# Patient Record
Sex: Female | Born: 1995 | Race: Black or African American | Hispanic: No | Marital: Single | Smoking: Current every day smoker
Health system: Southern US, Community
[De-identification: ages and names within clinical notes are randomized; demographics above are authoritative.]

---

## 2016-05-26 ENCOUNTER — Emergency Department (HOSPITAL_COMMUNITY): Payer: BLUE CROSS/BLUE SHIELD

## 2016-05-26 ENCOUNTER — Emergency Department (HOSPITAL_COMMUNITY)
Admission: EM | Admit: 2016-05-26 | Discharge: 2016-05-26 | Disposition: A | Discharge status: Home / Self Care | Payer: BLUE CROSS/BLUE SHIELD | Attending: Emergency Medicine | Admitting: Emergency Medicine

## 2016-05-26 ENCOUNTER — Encounter (HOSPITAL_COMMUNITY): Payer: Self-pay | Admitting: Emergency Medicine

## 2016-05-26 DIAGNOSIS — R0781 Pleurodynia: Secondary | ICD-10-CM | POA: Diagnosis present

## 2016-05-26 DIAGNOSIS — R0789 Other chest pain: Secondary | ICD-10-CM | POA: Diagnosis not present

## 2016-05-26 DIAGNOSIS — F17200 Nicotine dependence, unspecified, uncomplicated: Secondary | ICD-10-CM | POA: Diagnosis not present

## 2016-05-26 LAB — CBC WITH DIFFERENTIAL/PLATELET
Basophils Absolute: 0 10*3/uL (ref 0.0–0.1)
Basophils Relative: 0 %
EOS ABS: 0.2 10*3/uL (ref 0.0–0.7)
EOS PCT: 2 %
HCT: 41.2 % (ref 36.0–46.0)
Hemoglobin: 13.8 g/dL (ref 12.0–15.0)
LYMPHS ABS: 2.2 10*3/uL (ref 0.7–4.0)
Lymphocytes Relative: 25 %
MCH: 27.3 pg (ref 26.0–34.0)
MCHC: 33.5 g/dL (ref 30.0–36.0)
MCV: 81.4 fL (ref 78.0–100.0)
MONO ABS: 0.7 10*3/uL (ref 0.1–1.0)
MONOS PCT: 8 %
Neutro Abs: 6 10*3/uL (ref 1.7–7.7)
Neutrophils Relative %: 65 %
PLATELETS: 225 10*3/uL (ref 150–400)
RBC: 5.06 MIL/uL (ref 3.87–5.11)
RDW: 13.2 % (ref 11.5–15.5)
WBC: 9.1 10*3/uL (ref 4.0–10.5)

## 2016-05-26 LAB — COMPREHENSIVE METABOLIC PANEL
ALT: 13 U/L — ABNORMAL LOW (ref 14–54)
ANION GAP: 6 (ref 5–15)
AST: 19 U/L (ref 15–41)
Albumin: 4.4 g/dL (ref 3.5–5.0)
Alkaline Phosphatase: 53 U/L (ref 38–126)
BUN: 7 mg/dL (ref 6–20)
CHLORIDE: 107 mmol/L (ref 101–111)
CO2: 24 mmol/L (ref 22–32)
Calcium: 9.4 mg/dL (ref 8.9–10.3)
Creatinine, Ser: 0.77 mg/dL (ref 0.44–1.00)
Glucose, Bld: 106 mg/dL — ABNORMAL HIGH (ref 65–99)
Potassium: 3.6 mmol/L (ref 3.5–5.1)
Sodium: 137 mmol/L (ref 135–145)
Total Bilirubin: 1 mg/dL (ref 0.3–1.2)
Total Protein: 7.2 g/dL (ref 6.5–8.1)

## 2016-05-26 LAB — I-STAT TROPONIN, ED
Troponin i, poc: 0 ng/mL (ref 0.00–0.08)
Troponin i, poc: 0 ng/mL (ref 0.00–0.08)

## 2016-05-26 LAB — D-DIMER, QUANTITATIVE: D-Dimer, Quant: 0.36 ug/mL-FEU (ref 0.00–0.50)

## 2016-05-26 LAB — POC URINE PREG, ED: PREG TEST UR: NEGATIVE

## 2016-05-26 MED ORDER — NAPROXEN 500 MG PO TABS: 500.0000 mg | ORAL_TABLET | Freq: Two times a day (BID) | ORAL | 0 refills | Status: AC | PRN

## 2016-05-26 NOTE — ED Provider Notes (Signed)
[facility]-EMERGENCY DEPT Provider Note   CSN: [id number] Arrival date & time: 05/26/16  0144  First Provider Contact:  First MD Initiated Contact with Patient 05/26/16 0615      History   Chief Complaint Chief Complaint  Patient presents with  . Other    Bilateral ribcage pain    HPI   Samantha Huber is an 20 y.o. female with no significant PMH who presents to the ED for evaluation of bilateral lower rib cage pain. 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 She states the pain is constant. It is not worse with anything. Denies radiation of the pain. Denies known injury or trauma. Denies shortness of breath. She has not tried anything for the pain. She states this happens "every once in a blue moon." Denies associated abdominal pain, nausea, vomiting, fever, chills. Denies leg pain or swelling. Denies OCP use or recent travel. She states since she has been laying in the ED the pain has improved on its own.  History reviewed. No pertinent past medical history.  There are no active problems to display for this patient.   History reviewed. No pertinent surgical history.  OB History    No data available       Home Medications    Prior to Admission medications   Not on File    Family History No family history on file.  Social History Social History  Substance Use Topics  . Smoking status: Current Every Day Smoker  . Smokeless tobacco: Not on file  . Alcohol use Yes     Allergies   Review of patient's allergies indicates no known allergies.   Review of Systems Review of Systems 10 Systems reviewed and are negative for acute change except as noted in the HPI.   Physical Exam Updated Vital Signs BP 112/88   Pulse 92   Temp 98.9 F (37.2 C) (Oral)   Resp 16   Ht  (1.6 m)   Wt 49.4 kg   LMP 05/17/2016 (Approximate)   SpO2 100%   BMI 19.31 kg/m   Physical Exam  Constitutional: She is oriented to person, place, and time.    Thin, NAD  HENT:  Right Ear: External ear normal.  Left Ear: External ear normal.  Nose: Nose normal.  Mouth/Throat: Oropharynx is clear and moist. No oropharyngeal exudate.  Eyes: Conjunctivae are normal.  Neck: Neck supple.  Cardiovascular: Normal rate, regular rhythm, normal heart sounds and intact distal pulses.   Pulmonary/Chest: Effort normal and breath sounds normal. No respiratory distress. She has no wheezes. She exhibits tenderness.  Mild bilateral anterior chest wall tenderness  Abdominal: Soft. Bowel sounds are normal. She exhibits no distension. There is no tenderness. There is no rebound and no guarding.  Musculoskeletal: She exhibits no edema.  No LE edema or calf tenderness 2+ peripheral pulses throughout  Lymphadenopathy:    She has no cervical adenopathy.  Neurological: She is alert and oriented to person, place, and time. No cranial nerve deficit.  Skin: Skin is warm and dry.  Psychiatric: She has a normal mood and affect.  Nursing note and vitals reviewed.   Vitals:   05/26/16 0615 05/26/16 0630 05/26/16 0645 05/26/16 0700  BP: 120/80 110/77 122/79 112/88  Pulse: 72 69 79 92  Resp:      Temp:      TempSrc:      SpO2: 99% 100% 99% 100%  Weight:      Height:  ED Treatments / Results  Labs (all labs ordered are listed, but only abnormal results are displayed) Labs Reviewed  COMPREHENSIVE METABOLIC PANEL - Abnormal; Notable for the following:       Result Value   Glucose, Bld 106 (*)    ALT 13 (*)    All other components within normal limits  CBC WITH DIFFERENTIAL/PLATELET  D-DIMER, QUANTITATIVE (NOT [facility])  Rosezena SensorI-STAT TROPOININ, ED  POC URINE PREG, ED  I-STAT TROPOININ, ED    EKG  EKG Interpretation  Date/Time:  Thursday May 26 2016 02:05:39 EDT Ventricular Rate:  76 PR Interval:  126 QRS Duration: 80 QT Interval:  336 QTC Calculation: 378 R Axis:   91 Text Interpretation:  Normal sinus rhythm Rightward axis Nonspecific ST  abnormality Abnormal ECG No prior EKG  Confirmed by LIU MD, DANA [phone]([id number]) on 05/26/2016 3:21:59 AM       Radiology Dg Chest 2 View  Result Date: 05/26/2016 CLINICAL DATA:  Acute onset of bilateral rib pain, worse with deep breaths. Initial encounter. EXAM: CHEST  2 VIEW COMPARISON:  None. FINDINGS: The lungs are well-aerated and clear. There is no evidence of focal opacification, pleural effusion or pneumothorax. The heart is normal in size; the mediastinal contour is within normal limits. No acute osseous abnormalities are seen. IMPRESSION: No acute cardiopulmonary process seen. No displaced rib fractures identified. Electronically Signed   By: Roanna RaiderJeffery  Chang M.D.   On: 05/26/2016 02:22    Procedures Procedures (including critical care time)  Medications Ordered in ED Medications - No data to display   Initial Impression / Assessment and Plan / ED Course  I have reviewed the triage vital signs and the nursing notes.  Pertinent labs & imaging results that were available during my care of the patient were reviewed by me and considered in my medical decision making (see chart for details).  Clinical Course    Pt presenting with bilateral lower rib pain since last night. Pain has improved on its own in the ED and she declines any pain medicine. Her labs including a delta troponin and d-dimer are negative. CXR negative. EKG nonacute. Suspect costochondritis. Doubt ACS, PE, or other acute cardiopulmonary etiology. Encouraged a trial of naproxen as needed for pain. Resources given to establish PCP for follow up. ER return precautions given.  Final Clinical Impressions(s) / ED Diagnoses   Final diagnoses:  Chest wall pain    New Prescriptions New Prescriptions   NAPROXEN (NAPROSYN) 500 MG TABLET    Take 1 tablet (500 mg total) by mouth 2 (two) times daily as needed.     Carlene CoriaSerena Y Isidore Margraf, PA-C 05/26/16 [id number]    Zadie Rhineonald Wickline, MD 05/26/16 2352

## 2016-05-26 NOTE — Discharge Instructions (Signed)
You were seen in the emergency room today for evaluation of pain your rib cage. Your labs were normal. Your chest x-ray showed no acute abnormalities. I will give you a prescription for naproxen to take as needed for pain. Please follow up as soon as possible with your primary care provider. If you do not have one please call the phone number listed at the end of this packet for assistance in establishing primary care. Return to the ER for new or worsening symptoms.

## 2016-05-26 NOTE — ED Triage Notes (Signed)
Pt. reports bilateral lateral ribcage pain onset today with mild SOB and emesis .

## 2016-11-18 DIAGNOSIS — R1084 Generalized abdominal pain: Secondary | ICD-10-CM | POA: Diagnosis not present

## 2016-11-18 DIAGNOSIS — K219 Gastro-esophageal reflux disease without esophagitis: Secondary | ICD-10-CM | POA: Diagnosis not present

## 2016-11-18 DIAGNOSIS — K582 Mixed irritable bowel syndrome: Secondary | ICD-10-CM | POA: Diagnosis not present

## 2016-11-18 DIAGNOSIS — G43A0 Cyclical vomiting, not intractable: Secondary | ICD-10-CM | POA: Diagnosis not present

## 2016-11-30 DIAGNOSIS — N761 Subacute and chronic vaginitis: Secondary | ICD-10-CM | POA: Diagnosis not present

## 2016-11-30 DIAGNOSIS — N914 Secondary oligomenorrhea: Secondary | ICD-10-CM | POA: Diagnosis not present

## 2016-11-30 DIAGNOSIS — R87612 Low grade squamous intraepithelial lesion on cytologic smear of cervix (LGSIL): Secondary | ICD-10-CM | POA: Diagnosis not present

## 2016-11-30 DIAGNOSIS — R102 Pelvic and perineal pain: Secondary | ICD-10-CM | POA: Diagnosis not present

## 2016-12-03 DIAGNOSIS — N761 Subacute and chronic vaginitis: Secondary | ICD-10-CM | POA: Diagnosis not present

## 2016-12-28 DIAGNOSIS — K219 Gastro-esophageal reflux disease without esophagitis: Secondary | ICD-10-CM | POA: Diagnosis not present

## 2016-12-28 DIAGNOSIS — K580 Irritable bowel syndrome with diarrhea: Secondary | ICD-10-CM | POA: Diagnosis not present

## 2016-12-30 DIAGNOSIS — J029 Acute pharyngitis, unspecified: Secondary | ICD-10-CM | POA: Diagnosis not present

## 2016-12-30 DIAGNOSIS — L989 Disorder of the skin and subcutaneous tissue, unspecified: Secondary | ICD-10-CM | POA: Diagnosis not present

## 2017-01-02 ENCOUNTER — Other Ambulatory Visit: Payer: Self-pay | Admitting: Physician Assistant

## 2017-01-02 DIAGNOSIS — L989 Disorder of the skin and subcutaneous tissue, unspecified: Secondary | ICD-10-CM

## 2017-01-04 ENCOUNTER — Other Ambulatory Visit: Payer: BLUE CROSS/BLUE SHIELD

## 2017-01-04 DIAGNOSIS — J029 Acute pharyngitis, unspecified: Secondary | ICD-10-CM | POA: Diagnosis not present

## 2017-01-11 ENCOUNTER — Ambulatory Visit
Admission: RE | Admit: 2017-01-11 | Discharge: 2017-01-11 | Disposition: A | Discharge status: Home / Self Care | Payer: BLUE CROSS/BLUE SHIELD | Source: Ambulatory Visit | Attending: Physician Assistant | Admitting: Physician Assistant

## 2017-01-11 DIAGNOSIS — L989 Disorder of the skin and subcutaneous tissue, unspecified: Secondary | ICD-10-CM

## 2017-01-11 DIAGNOSIS — M799 Soft tissue disorder, unspecified: Secondary | ICD-10-CM | POA: Diagnosis not present

## 2017-01-27 DIAGNOSIS — R87612 Low grade squamous intraepithelial lesion on cytologic smear of cervix (LGSIL): Secondary | ICD-10-CM | POA: Diagnosis not present

## 2017-01-27 DIAGNOSIS — N914 Secondary oligomenorrhea: Secondary | ICD-10-CM | POA: Diagnosis not present

## 2017-03-29 ENCOUNTER — Other Ambulatory Visit: Payer: Self-pay | Admitting: Physician Assistant

## 2017-03-29 DIAGNOSIS — K582 Mixed irritable bowel syndrome: Secondary | ICD-10-CM | POA: Diagnosis not present

## 2017-03-29 DIAGNOSIS — R1032 Left lower quadrant pain: Secondary | ICD-10-CM

## 2017-03-29 DIAGNOSIS — R110 Nausea: Secondary | ICD-10-CM | POA: Diagnosis not present

## 2017-03-29 DIAGNOSIS — R1030 Lower abdominal pain, unspecified: Secondary | ICD-10-CM | POA: Diagnosis not present

## 2017-03-29 DIAGNOSIS — R142 Eructation: Secondary | ICD-10-CM | POA: Diagnosis not present

## 2017-03-29 DIAGNOSIS — R1031 Right lower quadrant pain: Secondary | ICD-10-CM

## 2017-04-07 ENCOUNTER — Ambulatory Visit
Admission: RE | Admit: 2017-04-07 | Discharge: 2017-04-07 | Disposition: A | Discharge status: Home / Self Care | Payer: BLUE CROSS/BLUE SHIELD | Source: Ambulatory Visit | Attending: Physician Assistant | Admitting: Physician Assistant

## 2017-04-07 ENCOUNTER — Other Ambulatory Visit: Payer: BLUE CROSS/BLUE SHIELD

## 2017-04-07 DIAGNOSIS — R1031 Right lower quadrant pain: Secondary | ICD-10-CM

## 2017-04-07 DIAGNOSIS — R1032 Left lower quadrant pain: Secondary | ICD-10-CM

## 2017-04-07 DIAGNOSIS — N838 Other noninflammatory disorders of ovary, fallopian tube and broad ligament: Secondary | ICD-10-CM | POA: Diagnosis not present

## 2017-04-07 DIAGNOSIS — K582 Mixed irritable bowel syndrome: Secondary | ICD-10-CM

## 2017-04-07 MED ORDER — IOPAMIDOL (ISOVUE-300) INJECTION 61%
100.0000 mL | Freq: Once | INTRAVENOUS | Status: AC | PRN
  Administered 2017-04-07: 100 mL via INTRAVENOUS

## 2017-04-17 DIAGNOSIS — E282 Polycystic ovarian syndrome: Secondary | ICD-10-CM | POA: Diagnosis not present

## 2017-04-17 DIAGNOSIS — N83201 Unspecified ovarian cyst, right side: Secondary | ICD-10-CM | POA: Diagnosis not present

## 2017-04-17 DIAGNOSIS — R102 Pelvic and perineal pain: Secondary | ICD-10-CM | POA: Diagnosis not present

## 2017-05-09 DIAGNOSIS — R102 Pelvic and perineal pain: Secondary | ICD-10-CM | POA: Diagnosis not present

## 2017-05-09 DIAGNOSIS — E282 Polycystic ovarian syndrome: Secondary | ICD-10-CM | POA: Diagnosis not present

## 2017-05-12 DIAGNOSIS — J019 Acute sinusitis, unspecified: Secondary | ICD-10-CM | POA: Diagnosis not present

## 2017-06-20 DIAGNOSIS — J309 Allergic rhinitis, unspecified: Secondary | ICD-10-CM | POA: Diagnosis not present

## 2017-06-20 DIAGNOSIS — L608 Other nail disorders: Secondary | ICD-10-CM | POA: Diagnosis not present

## 2017-06-20 DIAGNOSIS — R87612 Low grade squamous intraepithelial lesion on cytologic smear of cervix (LGSIL): Secondary | ICD-10-CM | POA: Diagnosis not present

## 2017-06-20 DIAGNOSIS — Z0000 Encounter for general adult medical examination without abnormal findings: Secondary | ICD-10-CM | POA: Diagnosis not present

## 2017-07-01 DIAGNOSIS — M419 Scoliosis, unspecified: Secondary | ICD-10-CM | POA: Diagnosis not present

## 2017-07-01 DIAGNOSIS — M25572 Pain in left ankle and joints of left foot: Secondary | ICD-10-CM | POA: Diagnosis not present

## 2017-07-01 DIAGNOSIS — S99912A Unspecified injury of left ankle, initial encounter: Secondary | ICD-10-CM | POA: Diagnosis not present

## 2017-07-01 DIAGNOSIS — X501XXA Overexertion from prolonged static or awkward postures, initial encounter: Secondary | ICD-10-CM | POA: Diagnosis not present

## 2017-07-06 DIAGNOSIS — S93409A Sprain of unspecified ligament of unspecified ankle, initial encounter: Secondary | ICD-10-CM | POA: Diagnosis not present

## 2017-07-06 DIAGNOSIS — M25572 Pain in left ankle and joints of left foot: Secondary | ICD-10-CM | POA: Diagnosis not present

## 2017-07-06 DIAGNOSIS — S93402A Sprain of unspecified ligament of left ankle, initial encounter: Secondary | ICD-10-CM | POA: Diagnosis not present

## 2017-08-09 DIAGNOSIS — Z3042 Encounter for surveillance of injectable contraceptive: Secondary | ICD-10-CM | POA: Diagnosis not present

## 2017-08-09 DIAGNOSIS — R87612 Low grade squamous intraepithelial lesion on cytologic smear of cervix (LGSIL): Secondary | ICD-10-CM | POA: Diagnosis not present

## 2017-08-09 DIAGNOSIS — Z124 Encounter for screening for malignant neoplasm of cervix: Secondary | ICD-10-CM | POA: Diagnosis not present

## 2017-08-23 DIAGNOSIS — N870 Mild cervical dysplasia: Secondary | ICD-10-CM | POA: Diagnosis not present

## 2017-08-23 DIAGNOSIS — R87612 Low grade squamous intraepithelial lesion on cytologic smear of cervix (LGSIL): Secondary | ICD-10-CM | POA: Diagnosis not present

## 2017-09-29 IMAGING — US US EXTREM LOW*L* LIMITED
1 series · 14 of 17 positions shown · non-contrast
Comparison: None.

CLINICAL DATA: Palpable left 5 skin lesion for 1 month.  No injury.

EXAM:
ULTRASOUND left LOWER EXTREMITY LIMITED
TECHNIQUE: Ultrasound examination of the lower extremity soft tissues was
performed in the area of clinical concern.

[Series 1: us extrem low*left* limited · 0.05mm/px · 17 acquisitions, 14 frames shown]
[im 1/17]
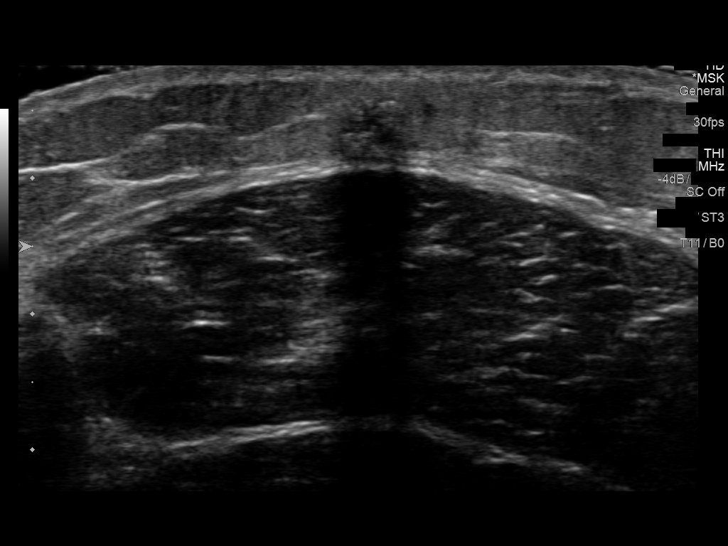
[im 2/17]
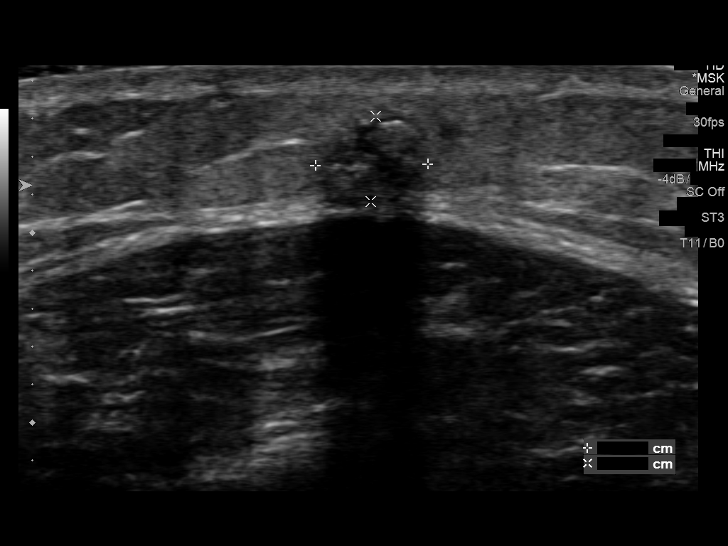
[im 4/17]
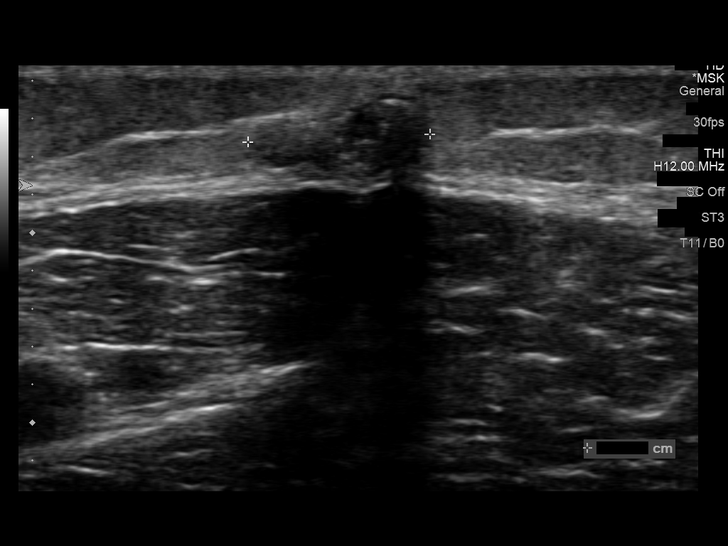
[im 5/17]
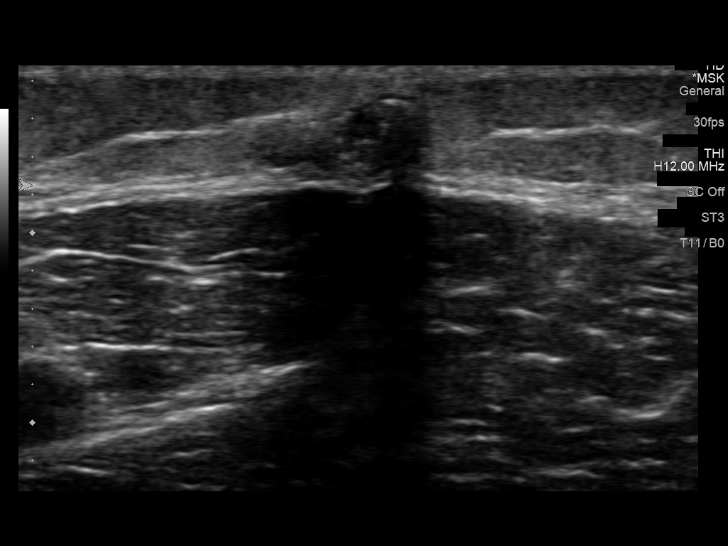
[im 6/17]
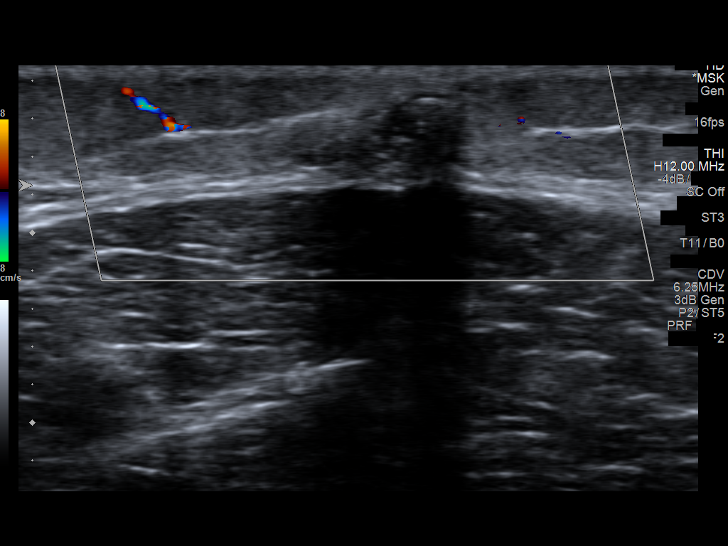
[im 7/17]
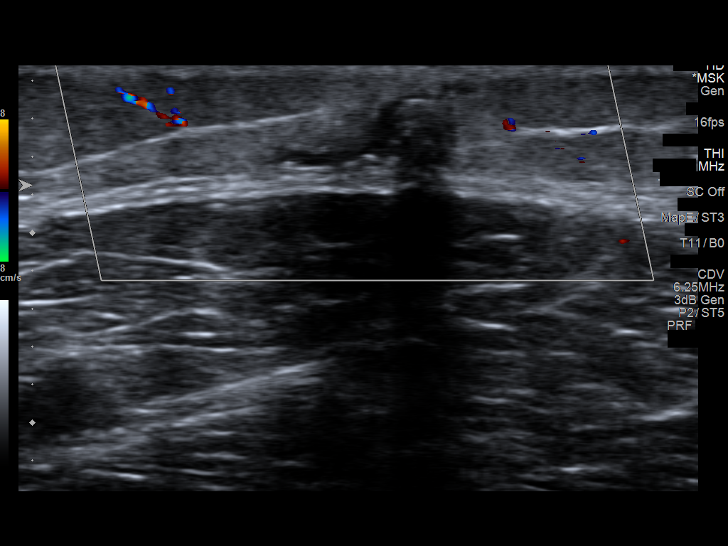
[im 8/17]
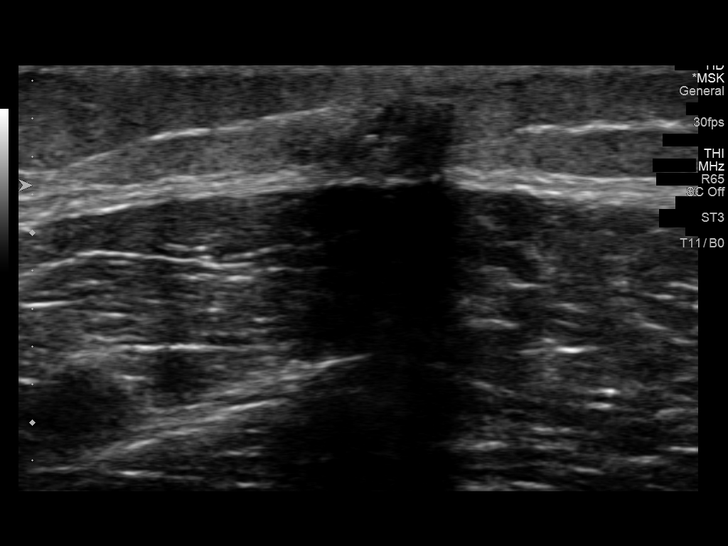
[im 10/17]
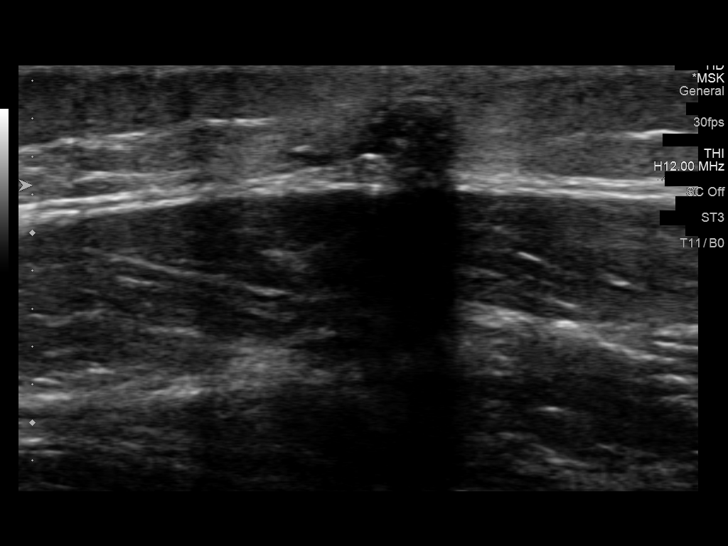
[im 11/17]
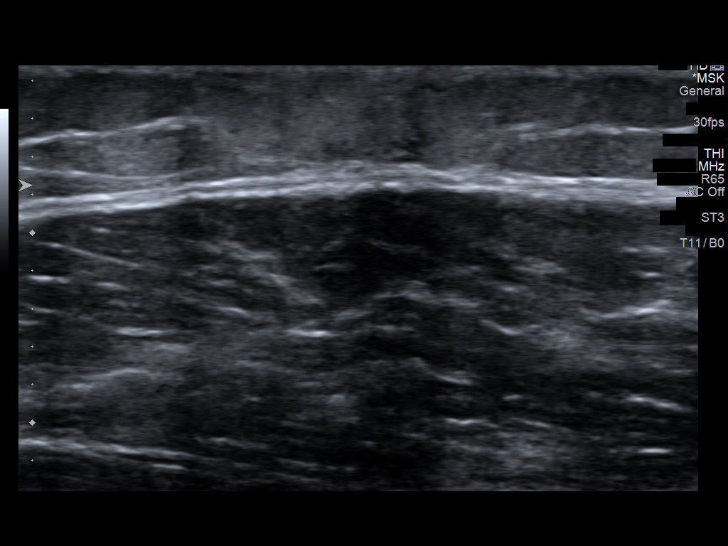
[im 12/17]
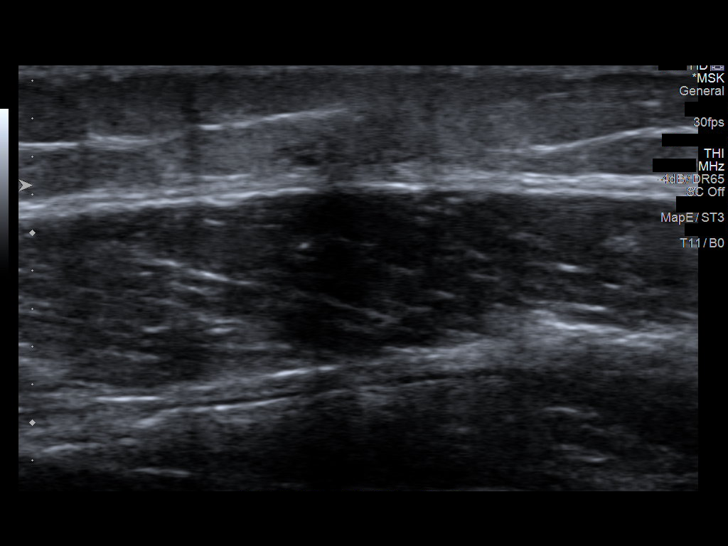
[im 13/17]
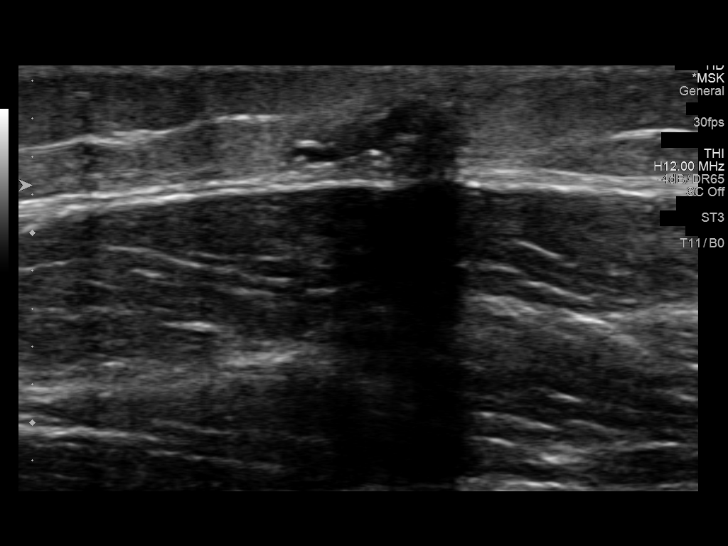
[im 14/17]
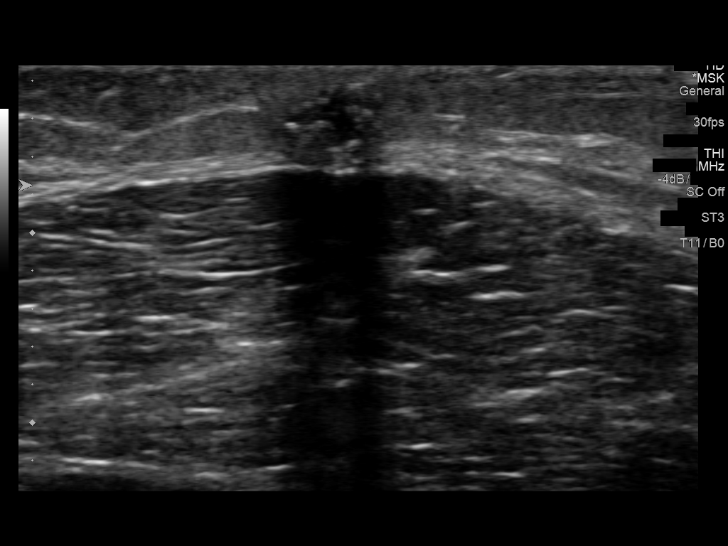
[im 16/17]
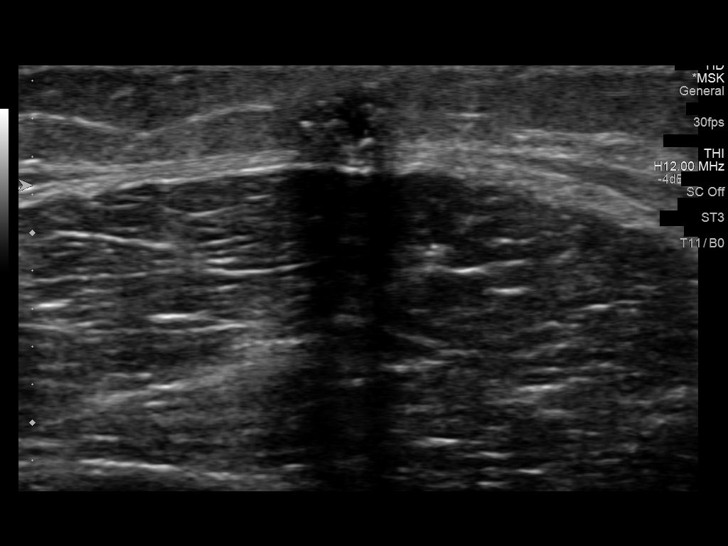
[im 17/17]
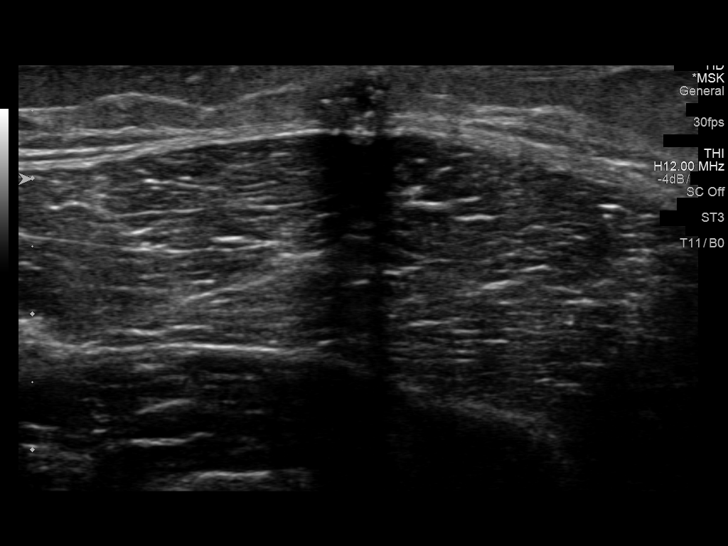

[14 of 17 positions shown; findings below may reference images not displayed]

FINDINGS: Joint Space: No effusion.

Muscles: Normal.

Tendons: Normal

Other Soft Tissue Structures: In the subcutaneous fat just
superficial to the muscle fascia there is a shadowing lesion
measuring 6 x 4.5 x 9.5 mm. No diagnostic imaging features. This
could be dystrophic calcification associated with prior trauma,
calcified fat necrosis or possibly a cryptic calcified vascular
lesion/hemangioma. No vascularity is demonstrated. The surrounding
subcutaneous fat appears normal. The muscles are normal.
IMPRESSION: Nonspecific calcified lesion in the subcutaneous fat most likely due
to remote trauma.

## 2017-10-12 DIAGNOSIS — J00 Acute nasopharyngitis [common cold]: Secondary | ICD-10-CM | POA: Diagnosis not present

## 2017-10-12 DIAGNOSIS — J038 Acute tonsillitis due to other specified organisms: Secondary | ICD-10-CM | POA: Diagnosis not present

## 2018-10-24 DIAGNOSIS — Z124 Encounter for screening for malignant neoplasm of cervix: Secondary | ICD-10-CM | POA: Diagnosis not present

## 2018-10-24 DIAGNOSIS — N870 Mild cervical dysplasia: Secondary | ICD-10-CM | POA: Diagnosis not present

## 2018-10-24 DIAGNOSIS — Z113 Encounter for screening for infections with a predominantly sexual mode of transmission: Secondary | ICD-10-CM | POA: Diagnosis not present

## 2018-10-24 DIAGNOSIS — Z01419 Encounter for gynecological examination (general) (routine) without abnormal findings: Secondary | ICD-10-CM | POA: Diagnosis not present

## 2018-11-28 DIAGNOSIS — R05 Cough: Secondary | ICD-10-CM | POA: Diagnosis not present

## 2018-11-28 DIAGNOSIS — B373 Candidiasis of vulva and vagina: Secondary | ICD-10-CM | POA: Diagnosis not present

## 2019-03-29 DIAGNOSIS — B373 Candidiasis of vulva and vagina: Secondary | ICD-10-CM | POA: Diagnosis not present

## 2019-03-29 DIAGNOSIS — Z3009 Encounter for other general counseling and advice on contraception: Secondary | ICD-10-CM | POA: Diagnosis not present

## 2019-06-06 DIAGNOSIS — N898 Other specified noninflammatory disorders of vagina: Secondary | ICD-10-CM | POA: Diagnosis not present

## 2019-08-29 DIAGNOSIS — R6889 Other general symptoms and signs: Secondary | ICD-10-CM | POA: Diagnosis not present

## 2019-08-29 DIAGNOSIS — Z20828 Contact with and (suspected) exposure to other viral communicable diseases: Secondary | ICD-10-CM | POA: Diagnosis not present
